# Patient Record
Sex: Male | Born: 1975 | Race: Black or African American | Hispanic: No | Marital: Married | Smoking: Never smoker
Health system: Southern US, Community
[De-identification: ages and names within clinical notes are randomized; demographics above are authoritative.]

---

## 2019-08-30 ENCOUNTER — Emergency Department (HOSPITAL_COMMUNITY)
Admission: EM | Admit: 2019-08-30 | Discharge: 2019-08-30 | Disposition: A | Discharge status: Home / Self Care | Payer: PRIVATE HEALTH INSURANCE | Attending: Emergency Medicine | Admitting: Emergency Medicine

## 2019-08-30 ENCOUNTER — Encounter (HOSPITAL_COMMUNITY): Payer: Self-pay | Admitting: *Deleted

## 2019-08-30 ENCOUNTER — Emergency Department (HOSPITAL_COMMUNITY): Payer: PRIVATE HEALTH INSURANCE

## 2019-08-30 ENCOUNTER — Other Ambulatory Visit: Payer: Self-pay

## 2019-08-30 DIAGNOSIS — R202 Paresthesia of skin: Secondary | ICD-10-CM

## 2019-08-30 DIAGNOSIS — R079 Chest pain, unspecified: Secondary | ICD-10-CM | POA: Diagnosis present

## 2019-08-30 LAB — CBC WITH DIFFERENTIAL/PLATELET
Abs Immature Granulocytes: 0.01 10*3/uL (ref 0.00–0.07)
Basophils Absolute: 0 10*3/uL (ref 0.0–0.1)
Basophils Relative: 0 %
Eosinophils Absolute: 0 10*3/uL (ref 0.0–0.5)
Eosinophils Relative: 1 %
HCT: 52.9 % — ABNORMAL HIGH (ref 39.0–52.0)
Hemoglobin: 16 g/dL (ref 13.0–17.0)
Immature Granulocytes: 0 %
Lymphocytes Relative: 39 %
Lymphs Abs: 2.4 10*3/uL (ref 0.7–4.0)
MCH: 23.8 pg — ABNORMAL LOW (ref 26.0–34.0)
MCHC: 30.2 g/dL (ref 30.0–36.0)
MCV: 78.6 fL — ABNORMAL LOW (ref 80.0–100.0)
Monocytes Absolute: 0.7 10*3/uL (ref 0.1–1.0)
Monocytes Relative: 11 %
Neutro Abs: 2.9 10*3/uL (ref 1.7–7.7)
Neutrophils Relative %: 49 %
Platelets: 162 10*3/uL (ref 150–400)
RBC: 6.73 MIL/uL — ABNORMAL HIGH (ref 4.22–5.81)
RDW: 16.4 % — ABNORMAL HIGH (ref 11.5–15.5)
WBC: 6 10*3/uL (ref 4.0–10.5)
nRBC: 0 % (ref 0.0–0.2)

## 2019-08-30 LAB — COMPREHENSIVE METABOLIC PANEL
ALT: 61 U/L — ABNORMAL HIGH (ref 0–44)
AST: 37 U/L (ref 15–41)
Albumin: 4.6 g/dL (ref 3.5–5.0)
Alkaline Phosphatase: 45 U/L (ref 38–126)
Anion gap: 10 (ref 5–15)
BUN: 14 mg/dL (ref 6–20)
CO2: 27 mmol/L (ref 22–32)
Calcium: 9.5 mg/dL (ref 8.9–10.3)
Chloride: 103 mmol/L (ref 98–111)
Creatinine, Ser: 1.21 mg/dL (ref 0.61–1.24)
GFR calc Af Amer: >60 mL/min (ref >60)
GFR calc non Af Amer: >60 mL/min (ref >60)
Glucose, Bld: 92 mg/dL (ref 70–99)
Potassium: 4.1 mmol/L (ref 3.5–5.1)
Sodium: 140 mmol/L (ref 135–145)
Total Bilirubin: 0.9 mg/dL (ref 0.3–1.2)
Total Protein: 7.8 g/dL (ref 6.5–8.1)

## 2019-08-30 LAB — MAGNESIUM: Magnesium: 1.9 mg/dL (ref 1.7–2.4)

## 2019-08-30 LAB — CBG MONITORING, ED: Glucose-Capillary: 94 mg/dL (ref 70–99)

## 2019-08-30 LAB — TROPONIN I (HIGH SENSITIVITY): Troponin I (High Sensitivity): 8 ng/L (ref <18)

## 2019-08-30 NOTE — ED Provider Notes (Signed)
[facility] EMERGENCY DEPARTMENT Provider Note   CSN: [id number] Arrival date & time: 08/30/19  1047     History Chief Complaint  Patient presents with  . Chest Pain    Adam Patel is a 44 y.o. male.  HPI 44 year old male presents with a chief complaint of left fingers tingling.  Ongoing for at least 2 weeks or so.  Comes and goes.  Often when he does certain activities such as gripping a steering well or a controller then he notices it more prominently.  Mostly in the 4 nonthumb fingers at the tips.  Over the last week or so he is noticed a little heaviness in his right wrist area.  Not really as much tingling.  Has noticed some chest tingling as well and sometimes has pressure on his chest but that is more when he sitting up.  Has not had any of this since last night.  No headache, shortness of breath, neck or back pain.  No leg symptoms.  Symptoms seem to come and go.  Currently his only symptoms are the tingling in his fingertips on the left side. No known medical problems.  History reviewed. No pertinent past medical history.  There are no problems to display for this patient.   History reviewed. No pertinent surgical history.     No family history on file.  Social History   Tobacco Use  . Smoking status: Never Smoker  . Smokeless tobacco: Never Used  Substance Use Topics  . Alcohol use: Not Currently  . Drug use: Not Currently    Home Medications Prior to Admission medications   Not on File    Allergies    Patient has no known allergies.  Review of Systems   Review of Systems  Respiratory: Positive for chest tightness. Negative for shortness of breath.   Cardiovascular: Negative for chest pain.  Gastrointestinal: Negative for abdominal pain.  Musculoskeletal: Negative for back pain and neck pain.  Neurological: Positive for numbness. Negative for headaches.  All other systems reviewed and are negative.   Physical Exam Updated Vital Signs BP (!)  158/110 (BP Location: Left Arm)   Pulse 85   Temp 98.3 F (36.8 C) (Oral)   Resp 19   Ht 5\' 10"  (1.778 m)   Wt 106.6 kg   SpO2 98%   BMI 33.72 kg/m   Physical Exam Vitals and nursing note reviewed.  Constitutional:      Appearance: He is well-developed.  HENT:     Head: Normocephalic and atraumatic.     Right Ear: External ear normal.     Left Ear: External ear normal.     Nose: Nose normal.  Eyes:     General:        Right eye: No discharge.        Left eye: No discharge.     Extraocular Movements: Extraocular movements intact.     Pupils: Pupils are equal, round, and reactive to light.  Cardiovascular:     Rate and Rhythm: Normal rate and regular rhythm.     Pulses:          Radial pulses are 2+ on the right side and 2+ on the left side.     Heart sounds: Normal heart sounds.  Pulmonary:     Effort: Pulmonary effort is normal.     Breath sounds: Normal breath sounds.  Abdominal:     Palpations: Abdomen is soft.     Tenderness: There is no abdominal tenderness.  Musculoskeletal:     Cervical back: Normal range of motion and neck supple.  Skin:    General: Skin is warm and dry.  Neurological:     Mental Status: He is alert.     Comments: CN 3-12 grossly intact. 5/5 strength in all 4 extremities. Grossly normal sensation. He feels the tingling in left finger pads but notes that me touching feels normal as well. Normal finger to nose.   Psychiatric:        Mood and Affect: Mood is not anxious.     ED Results / Procedures / Treatments   Labs (all labs ordered are listed, but only abnormal results are displayed) Labs Reviewed  COMPREHENSIVE METABOLIC PANEL - Abnormal; Notable for the following components:      Result Value   ALT 61 (*)    All other components within normal limits  CBC WITH DIFFERENTIAL/PLATELET - Abnormal; Notable for the following components:   RBC 6.73 (*)    HCT 52.9 (*)    MCV 78.6 (*)    MCH 23.8 (*)    RDW 16.4 (*)    All other  components within normal limits  MAGNESIUM  CBG MONITORING, ED  TROPONIN I (HIGH SENSITIVITY)    EKG EKG Interpretation  Date/Time:  Saturday August 30 2019 11:33:26 EST Ventricular Rate:  83 PR Interval:    QRS Duration: 120 QT Interval:  366 QTC Calculation: 430 R Axis:   68 Text Interpretation: Sinus rhythm Nonspecific intraventricular conduction delay Nonspecific T abnormalities, diffuse leads Baseline wander in lead(s) II III aVF Artifact No old tracing to compare Confirmed by Pricilla Loveless [phone]) on 08/30/2019 12:16:25 PM   Radiology DG Chest 2 View  Result Date: 08/30/2019 CLINICAL DATA:  Chest tightness. EXAM: CHEST - 2 VIEW COMPARISON:  None. FINDINGS: The heart size and mediastinal contours are within normal limits. Eventration of the right hemidiaphragm. The lungs are clear. No pneumothorax or pleural effusion. The visualized skeletal structures are unremarkable. IMPRESSION: No active cardiopulmonary disease. Electronically Signed   By: Emmaline Kluver M.D.   On: 08/30/2019 12:21    Procedures Procedures (including critical care time)  Medications Ordered in ED Medications - No data to display  ED Course  I have reviewed the triage vital signs and the nursing notes.  Pertinent labs & imaging results that were available during my care of the patient were reviewed by me and considered in my medical decision making (see chart for details).    MDM Rules/Calculators/A&P                      Patient does not really have any chest pain or pressure but he has some vague complaints so a troponin was sent in addition to ECG.  Has nonspecific T wave changes that are probably chronic but no old to compare to.  Troponin negative.  No chest discomfort today and last was last night so I think ACS is unlikely.  Doubt PE, dissection, etc.  Likely some peripheral neuropathy/paresthesias.  Electrolytes are okay.  I will refer to neurology for this.  As for his blood pressure, it  was elevated on arrival but on recheck it is in a more normal level.  Thus I do not think he needs to immediately be placed on hypertension meds but needs a follow-up with the PCP.  Return precautions but at this point he appears stable for outpatient follow-up and management. Final Clinical Impression(s) / ED Diagnoses Final diagnoses:  Nonspecific chest pain  Paresthesia    Rx / DC Orders ED Discharge Orders    None       Sherwood Gambler, MD 08/30/19 1337

## 2019-08-30 NOTE — Discharge Instructions (Addendum)
If you develop chest pain, shortness of breath, fever, vomiting, abdominal or back pain, headache, new or worsening numbness and/or weakness, or any other new/concerning symptoms then return to the ER for evaluation.

## 2019-08-30 NOTE — ED Triage Notes (Signed)
Pt c/o tingling sensation that started in left fingers at first two weeks ago, then the tingling sensation spread to right arm as well and then started to spread across the chest area, pt reports that he was seen at urgent care in [place] a few days ago for same,

## 2020-08-01 IMAGING — DX DG CHEST 2V
2 series · 2 of 2 positions shown · non-contrast
Comparison: None.

CLINICAL DATA: Chest tightness.

EXAM:
CHEST - 2 VIEW

[chest pa]
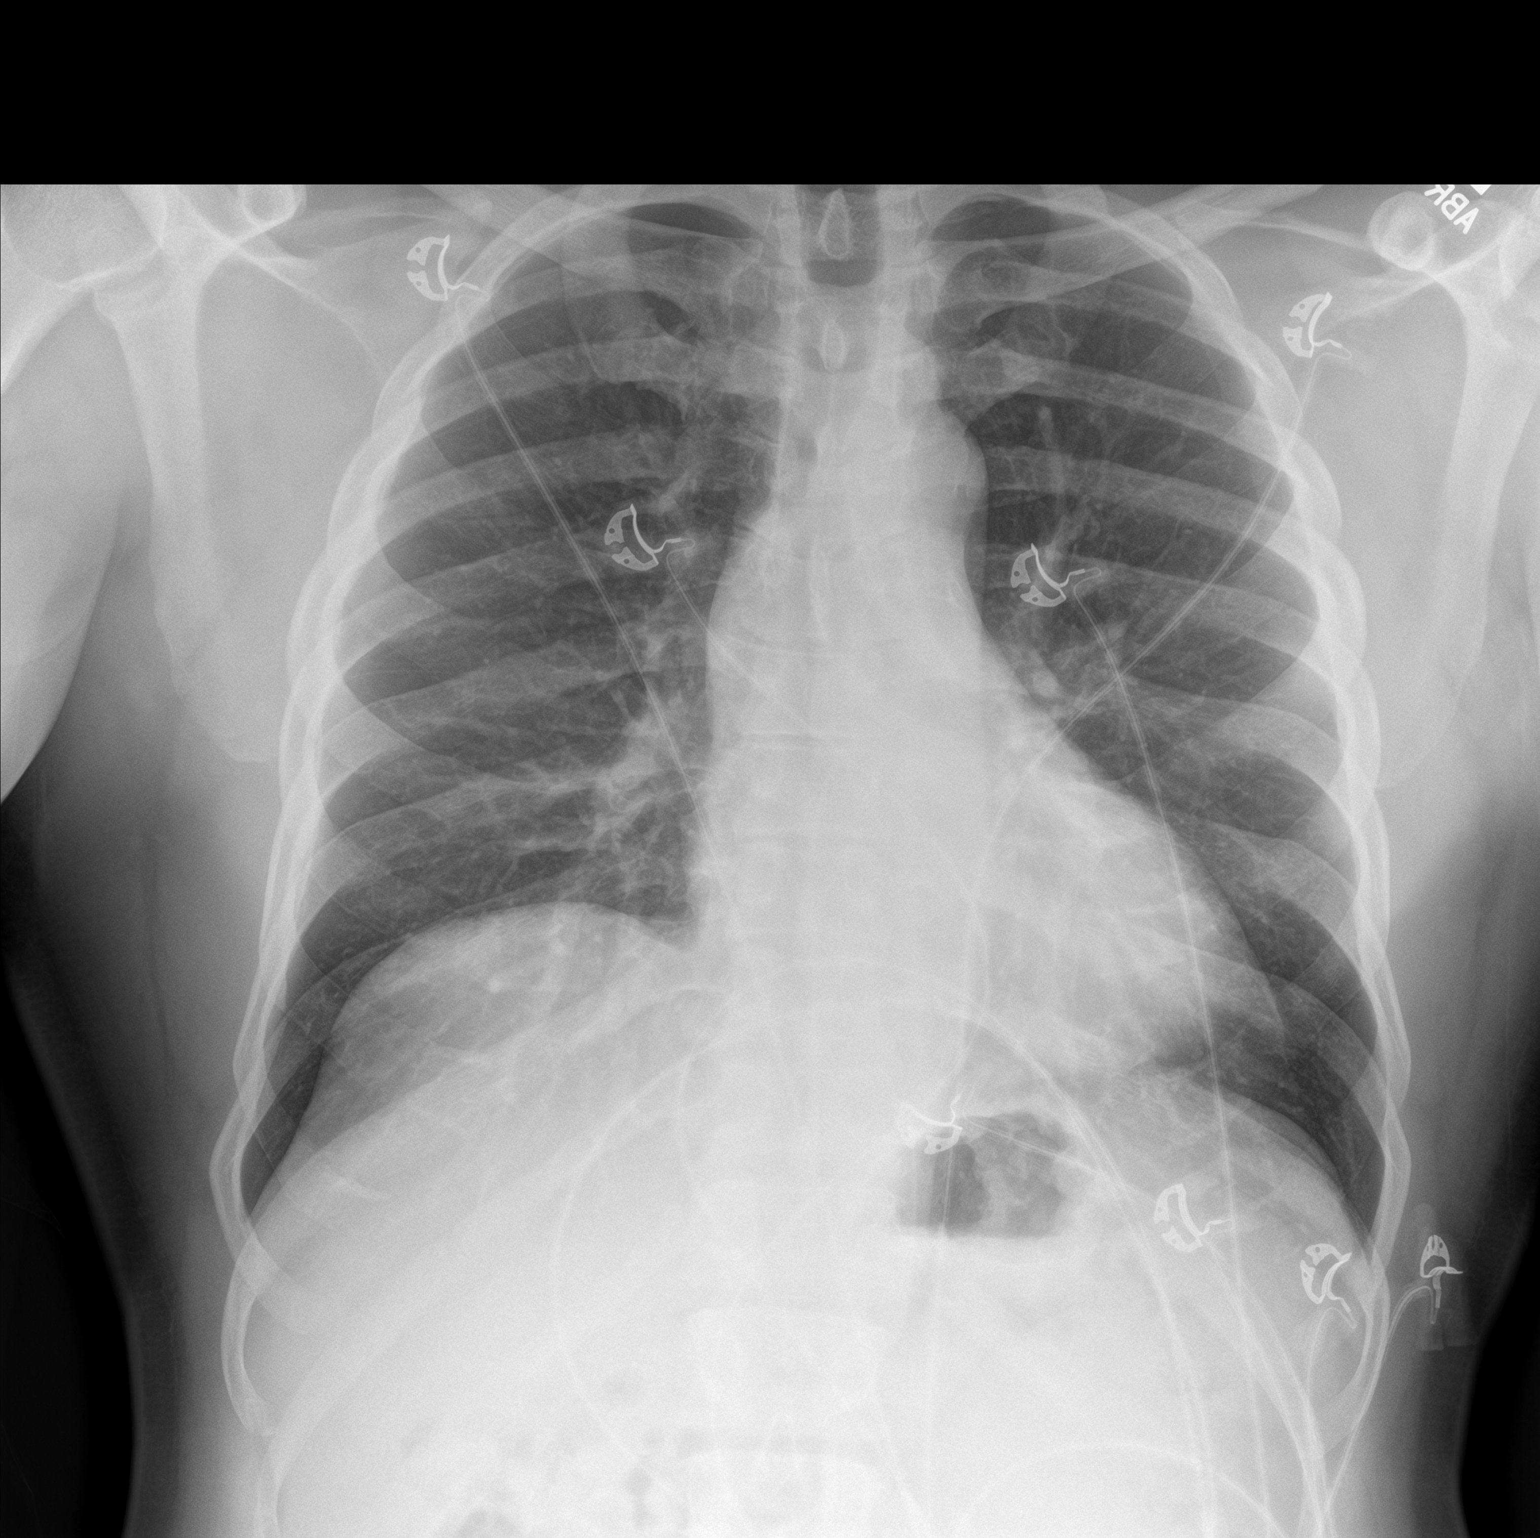

[chest lat]
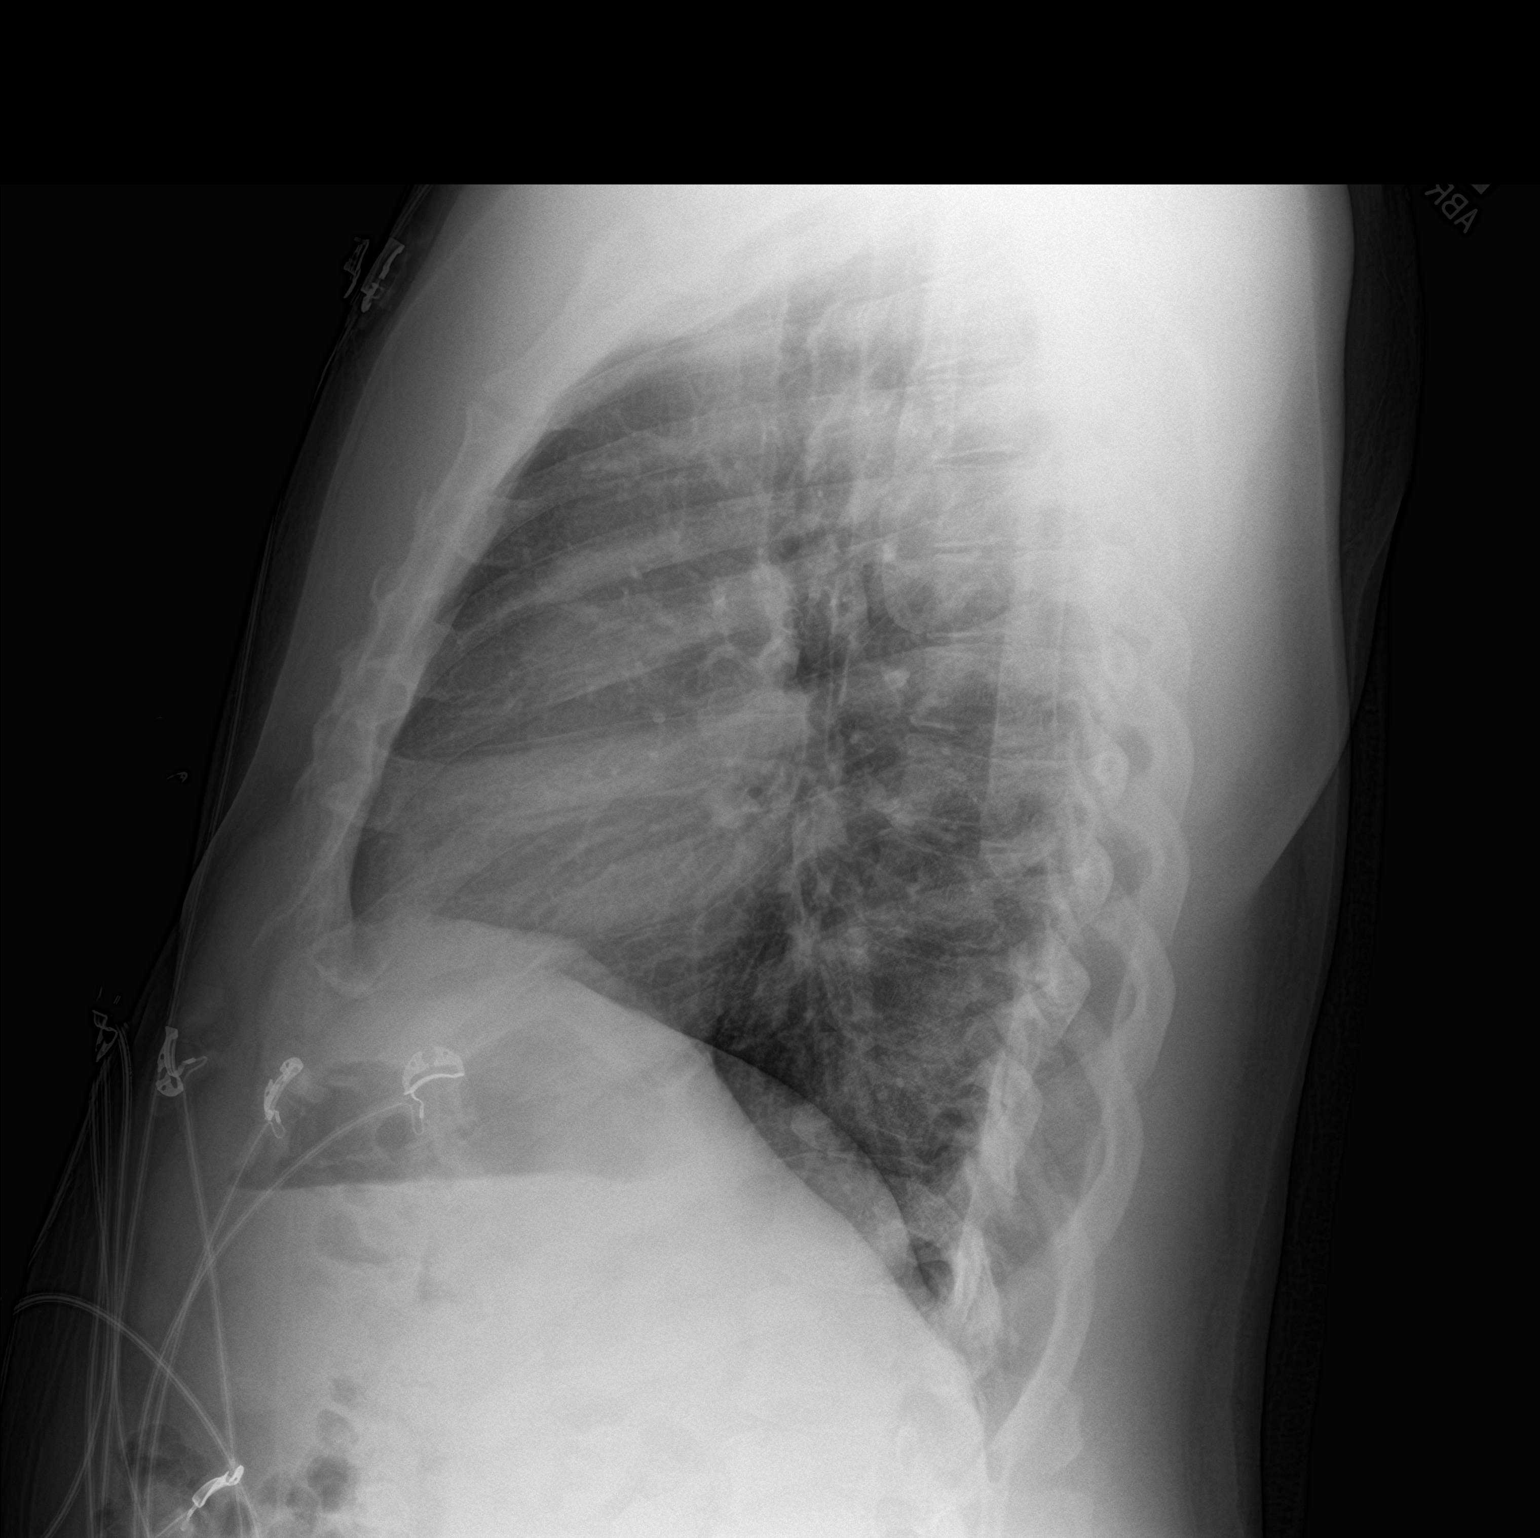

[2 of 2 positions shown; findings below may reference images not displayed]

FINDINGS: The heart size and mediastinal contours are within normal limits.
Eventration of the right hemidiaphragm. The lungs are clear. No
pneumothorax or pleural effusion. The visualized skeletal structures
are unremarkable.
IMPRESSION: No active cardiopulmonary disease.
